# Patient Record
Sex: Female | Born: 2005 | Race: White | Hispanic: No | Marital: Single | Smoking: Never smoker
Health system: Southern US, Community
[De-identification: ages and names within clinical notes are randomized; demographics above are authoritative.]

## PROBLEM LIST (undated history)

## (undated) HISTORY — PX: MYRINGOPLASTY: SUR873

---
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
## 2023-04-29 DIAGNOSIS — W57XXXA Bitten or stung by nonvenomous insect and other nonvenomous arthropods, initial encounter: Secondary | ICD-10-CM | POA: Diagnosis not present

## 2023-04-29 DIAGNOSIS — S50862A Insect bite (nonvenomous) of left forearm, initial encounter: Secondary | ICD-10-CM | POA: Diagnosis not present

## 2023-05-13 DIAGNOSIS — Z118 Encounter for screening for other infectious and parasitic diseases: Secondary | ICD-10-CM | POA: Diagnosis not present

## 2023-05-13 DIAGNOSIS — Z1331 Encounter for screening for depression: Secondary | ICD-10-CM | POA: Diagnosis not present

## 2023-05-13 DIAGNOSIS — Z01419 Encounter for gynecological examination (general) (routine) without abnormal findings: Secondary | ICD-10-CM | POA: Diagnosis not present

## 2023-10-11 DIAGNOSIS — R509 Fever, unspecified: Secondary | ICD-10-CM | POA: Diagnosis not present

## 2023-10-11 DIAGNOSIS — J0300 Acute streptococcal tonsillitis, unspecified: Secondary | ICD-10-CM | POA: Diagnosis not present

## 2024-02-08 DIAGNOSIS — Z1152 Encounter for screening for COVID-19: Secondary | ICD-10-CM | POA: Diagnosis not present

## 2024-02-08 DIAGNOSIS — J020 Streptococcal pharyngitis: Secondary | ICD-10-CM | POA: Diagnosis not present
